# Patient Record
Sex: Male | Born: 1950 | Race: White | Hispanic: No
Health system: Midwestern US, Community
[De-identification: ages and names within clinical notes are randomized; demographics above are authoritative.]

---

## 2016-12-07 NOTE — Discharge Summary (Signed)
Inpatient Patient Summary               [facility]  [address]  [place], [place] [address]  [phone]  Patient Discharge Instructions    Name: Eric Whitney, Eric Whitney  Current Date: 12/07/2016 09:49:21  DOB: 1951-05-02 MRN: [id number] FIN: NBR%[phone]  Patient Address: [address] [place] [facility] [address]  Patient Phone: [phone]  Primary Care Provider:  Name: Eric Whitney  Phone: [phone]   Immunizations Provided:      Discharge Diagnosis: 1:Left inguinal hernia  Discharged To: TO, ANTICIPATED%>  Home Treatments: TREATMENTS, ANTICIPATED%>  Devices/Equipment: EQUIPMENT REHAB%>  Post Hospital Services: HOSPITAL SERVICES%>  Professional Skilled Services: SKILLED SERVICES%>  Therapist, sports and Community Resources: SERV AND COMM RES, ANTICIPATED%>  Mode of Discharge Transportation: TRANSPORTATION%>  Discharge Orders         Discharge Patient 12/07/16 9:22:00 EDT, Discharge Home/Self Care        Comment:     Medications   During the course of your visit, your medication list was updated with the most current information. The details of those changes are reflected below:         New Medications  Printed Prescriptions  HYDROcodone-acetaminophen (Norco 325 mg-5 mg oral tablet) 1-2 tabs Oral (given by mouth) every 6 hours as needed moderate pain (4-7). Refills: 0.  Last Dose:____________________  Medications That Have Not Changed  Other Medications  fenofibrate (fenofibrate 145 mg oral tablet) 1 Tabs Oral (given by mouth) every day.  Last Dose:____________________  losartan-hydrochlorothiazide (losartan-hydrochlorothiazide 100 mg-25 mg oral tablet) 1 Tabs Oral (given by mouth) every day.  Last Dose:____________________  naproxen (naproxen 250 mg oral tablet) 1 Tabs Oral (given by mouth) 2 times a day.  Last Dose:____________________  rosuvastatin (rosuvastatin 20 mg oral tablet) 1 Tabs Oral (given by mouth) every day.  Last Dose:____________________  zolpidem (zolpidem 10 mg oral tablet) 1  Tabs Oral (given by mouth) Once a Day (at bedtime) as needed., " THIS MEDICATION IS ASSOCIATED WITH AN INCREASED RISK OF FALLS."  Last Dose:____________________        [facility] would like to thank you for allowing [place] to assist you with your healthcare needs. The following includes patient education materials and information regarding your injury/illness.    Eric Whitney has been given the following list of follow-up instructions, prescriptions, and patient education materials:  Follow-up Instructions:             With: Address: When:   [facility] [address]  [place], [place]  [address]  [phone] Business (1) Within 1 to 2 weeks                 It is important to always keep an active list of medications available so that you can share with other providers and manage your medications appropriately. As an additional courtesy, we are also providing you with your final active medications list that you can keep with you.          fenofibrate (fenofibrate 145 mg oral tablet) 1 Tabs Oral (given by mouth) every day.  HYDROcodone-acetaminophen (Norco 325 mg-5 mg oral tablet) 1-2 tabs Oral (given by mouth) every 6 hours as needed moderate pain (4-7). Refills: 0.  losartan-hydrochlorothiazide (losartan-hydrochlorothiazide 100 mg-25 mg oral tablet) 1 Tabs Oral (given by mouth) every day.  naproxen (naproxen 250 mg oral tablet) 1 Tabs Oral (given by mouth) 2 times a day.  rosuvastatin (rosuvastatin 20 mg oral  tablet) 1 Tabs Oral (given by mouth) every day.  zolpidem (zolpidem 10 mg oral tablet) 1 Tabs Oral (given by mouth) Once a Day (at bedtime) as needed., " THIS MEDICATION IS ASSOCIATED WITH AN INCREASED RISK OF FALLS."      Take only the medications listed above. Contact your doctor prior to taking any medications not on this list.      Discharge instructions, if any, will display below    Instructions for Diet: INSTRUCTIONS FOR DIET%>Regular diet   Instructions for Supplements: SUPPLEMENT  INSTRUCTIONS%>   Instructions for Activity: INSTRUCTIONS FOR ACTIVITY%>May shower   Instructions for Wound Care: INSTRUCTIONS FOR WOUND CARE%>    Medication leaflets, if any, will display below     Patient education materials, if any, will display below               Do you know the facts about opioid pain medications?   What is an Opioid?  An opioid is a strong prescription pain medication. Some possible side effects include nausea/vomiting, sleepiness/dizziness &/or constipation  Common names of opioids:    Hydrocodone (Vicodin, Norco)    Oxycodone (Percocet, OxyContin)    Morphine    Codeine (Tylenol #3, Tylenol #4)    Fentanyl    Tramadol (Ultram)    Methadone    Hydromorphone (Dilaudid)    Oxymorphone (Opana)   Only use your opioids for the reason they were prescribed.   Using opioids safely    Ask your surgeon if it is okay to use over-the-counter acetaminophen (Tylenol) or ibuprofen (Motrin, Advil).    Use your opioids if you still have severe pain, that is not controlled with the over-the-counter medications, or other non-opioid   prescriptions.    Let your doctor know if you are currently taking any benzodiazepines (i.e. Valium, Xanax).    Do not mix opioids with alcohol or other medications that can cause drowsiness.    As your pain gets better, wait longer between taking opioids.    Only use your opioids for your surgical pain. Do not use your pills for other reasons.    Your opioids are only for you. Do not share your pills with others.   Know the facts about opioid addiction   You are at higher risk of developing a dependence or an addiction to opioids if you:    Have a history of depression or anxiety.    Have a history of using or abusing alcohol, tobacco or drugs (including prescription or street drugs).    Have a history of long term (chronic) pain.    Take opioids for longer than a week.    Take more pills, more often, than your doctor prescribed.   Opioid use puts you at  risk of dependence, addiction or overdose!   Understanding after surgery   Our goal is to control your pain enough to do the things you need to do to heal: walk, sleep, eat & breath deeply.   Things to know:    Pain after surgery is normal.    Everyone feels pain differently.    Pain is usually worse for the first 2-3 days after surgery.    Most patients report using less than half of their opioid pills; many patients do not use any of their pills!   Other things to try for pain relief:    Relaxation, meditation, and music can help control your pain.    Talk to your doctor if your pain is not controlled.  Contact Your Surgeon if you are having difficulty managing your pain.   Safely store opioids out of reach of infants, children, teens & pets.    Lock your pills if possible.    Try to keep a count of how many pills you have left.    Do not store your opioids in places that allow easy access to your pills. (Example: bathrooms, kitchens)   SAFELY dispose of unused opioids:    Medication Take-Back Drives    Pharmacy & police station drop boxes    Mix drugs (do not crush) with used coffee grounds or kitty litter in a plastic bag, then throw away.   To find a list of local places that will take back your unused opioids, visit: http://[email or web address]/take-action/  Reference:   The information in this brochure was taken and adapted from patient materials provided by [employer]. For more information, visit [place].          Discharge Instructions: After Your Surgery   Youve just had surgery. During surgery, you were given medicine called anesthesia to keep you relaxed and free of pain. After surgery, you may have some pain or nausea. This is common. Here are some tips for feeling better and getting well after surgery.       Stay on schedule with your medicine.    Going home   Your healthcare provider will show you how to take care of yourself when you go home. He or she will also answer your  questions. Have an adult family member or friend drive you home. For the first 24 hours after your surgery:    Do not drive or use heavy equipment.    Do not make important decisions or sign legal papers.    Do not drink alcohol.    Have someone stay with you, if needed. He or she can watch for problems and help keep you safe.   Be sure to go to all follow-up visits with your healthcare provider. And rest after your surgery for as long as your healthcare provider tells you to.   Coping with pain   If you have pain after surgery, pain medicine will help you feel better. Take it as told, before pain becomes severe. Also, ask your healthcare provider or pharmacist about other ways to control pain. This might be with heat, ice, or relaxation. And follow any other instructions your surgeon or nurse gives you.   Tips for taking pain medicine   To get the best relief possible, remember these points:    Pain medicines can upset your stomach. Taking them with a little food may help.    Most pain relievers taken by mouth need at least 20 to 30 minutes to start to work.    Taking medicine on a schedule can help you remember to take it. Try to time your medicine so that you can take it before starting an activity. This might be before you get dressed, go for a walk, or sit down for dinner.    Constipation is a common side effect of pain medicines. Call your healthcare provider before taking any medicines such as laxatives or stool softeners to help ease constipation. Also ask if you should skip any foods. Drinking lots of fluids and eating foods such as fruits and vegetables that are high in fiber can also help. Remember, do not take laxatives unless your surgeon has prescribed them.    Drinking alcohol and taking pain medicine can cause dizziness and  slow your breathing. It can even be deadly. Do not drink alcohol while taking pain medicine.    Pain medicine can make you react more slowly to things. Do not drive or  run machinery while taking pain medicine.   Your healthcare provider may tell you to take acetaminophen to help ease your pain. Ask him or her how much you are supposed to take each day. Acetaminophen or other pain relievers may interact with your prescription medicines or other over-the-counter (OTC) medicines. Some prescription medicines have acetaminophen and other ingredients. Using both prescription and OTC acetaminophen for pain can cause you to overdose. Read the labels on your OTC medicines with care. This will help you to clearly know the list of ingredients, how much to take, and any warnings. It may also help you not take too much acetaminophen. If you have questions or do not understand the information, ask your pharmacist or healthcare provider to explain it to you before you take the OTC medicine.   Managing nausea   Some people have an upset stomach after surgery. This is often because of anesthesia, pain, or pain medicine, or the stress of surgery. These tips will help you handle nausea and eat healthy foods as you get better. If you were on a special food plan before surgery, ask your healthcare provider if you should follow it while you get better. These tips may help:    Do not push yourself to eat. Your body will tell you when to eat and how much.    Start off with clear liquids and soup. They are easier to digest.    Next try semi-solid foods, such as mashed potatoes, applesauce, and gelatin, as you feel ready.    Slowly move to solid foods. Dont eat fatty, rich, or spicy foods at first.    Do not force yourself to have 3 large meals a day. Instead eat smaller amounts more often.    Take pain medicines with a small amount of solid food, such as crackers or toast, to avoid nausea.       Call your surgeon if.    You still have pain an hour after taking medicine. The medicine may not be strong enough.    You feel too sleepy, dizzy, or groggy. The medicine may be too strong.    You have  side effects like nausea, vomiting, or skin changes, such as rash, itching, or hives.        If you have obstructive sleep apnea   You were given anesthesia medicine during surgery to keep you comfortable and free of pain. After surgery, you may have more apnea spells because of this medicine and other medicines you were given. The spells may last longer than usual.    At home:    Keep using the continuous positive airway pressure (CPAP) device when you sleep. Unless your healthcare provider tells you not to, use it when you sleep, day or night. CPAP is a common device used to treat obstructive sleep apnea.    Talk with your provider before taking any pain medicine, muscle relaxants, or sedatives. Your provider will tell you about the possible dangers of taking these medicines.      2000-2017 [employer] [address], [place], [place] [address]. All rights reserved. This information is not intended as a substitute for professional medical care. Always follow your healthcare professional's instructions.           IS IT A STROKE?  Act FAST and Check for these signs:    FACE                         Does the face look uneven?    ARM                         Does one arm drift down?    SPEECH                    Does their speech sound strange?    TIME                         Call 9-1-1 at any sign of stroke  Heart Attack Signs  Chest discomfort: Most heart attacks involve discomfort in the center of the chest and lasts more than a few minutes, or goes away and comes back. It can feel like uncomfortable pressure, squeezing, fullness or pain.  Discomfort in upper body: Symptoms can include pain or discomfort in one or both arms, back, neck, jaw or stomach.  Shortness of breath: With or without discomfort.  Other signs: Breaking out in a cold sweat, nausea, or lightheaded.  Remember, MINUTES DO MATTER. If you experience any of these heart attack warning signs, call 9-1-1 to get immediate medical attention!      ---------------------------------------------------------------------------------------------------------------------  [facility] allows you to manage your health, view your test results, and retrieve your discharge documents from your hospital stay securely and conveniently from your computer.  To begin the enrollment process, visit [email or web address] Click on "Sign up now" under [facility].

## 2016-12-07 NOTE — Procedures (Signed)
 IntraOp Record - [facility]             IntraOp Record - [facility] Summary                                                                   Primary Physician:        CHERLYNN BARE III    Case Number:              [id number]    Finalized Date/Time:      12/07/16 09:32:25    Pt. Name:                 Eric Whitney, Eric Whitney    D.O.B./Sex:               July 12, 1950    Male    Med Rec #:                [id number]    Physician:                CHERLYNN BARE III    Financial #:              [id number]    Pt. Type:                 S    Room/Bed:                 /    Admit/Disch:              12/07/16 06:06:00 -    Institution:       [facility] - Case Times                                                                                                         Entry 1                                                                                                          Patient      In Room Time             12/07/16 07:57:00               Out Room Time                   12/07/16 09:26:00    Anesthesia     Procedure  Start Time               12/07/16 08:11:00               Stop Time                       12/07/16 09:16:00    Last Modified By:         MALVINA RN, RON D                              12/07/16 09:31:52      [facility] - Case Times Audit                                                                          12/07/16 09:31:52         Owner: [employer]                               Modifier: CLAYBORN                                                        <+> 1         Out Room Time        <+> 1         Stop Time        [facility] - Case Attendance                                                                                                    Entry 1                         Entry 2                         Entry 3                                          Case Attendee             FROHOCK-MD, JR, JEFFREY         GRICE-MD,  ZACHARY DOUGLAS MALVINA, RN, RON D                              JR  Role Performed            Anesthesiologist                 Surgeon Primary                 Circulator    Time In                   12/07/16 07:57:00               12/07/16 07:57:00               12/07/16 07:57:00    Time Out                  12/07/16 09:26:00               12/07/16 09:26:00               12/07/16 09:26:00    Procedure                 Hernia Repair Inguinal          Hernia Repair Inguinal          Hernia Repair Inguinal                              Open(Left)                      Open(Left)                      Open(Left)    Last Modified By:         MALVINA RN, RON JONETTA MALVINA, RN, RON JONETTA MALVINA, RN, RON D                              12/07/16 09:32:21               12/07/16 09:32:21               12/07/16 09:32:21                                Entry 4                                                                                                          Case Attendee             LOCKIE KUNG C    Role Performed            Surgical Scrub    Time In                   12/07/16  07:57:00    Time Out                  12/07/16 09:26:00    Procedure                 Hernia Repair Inguinal                              Open(Left)    Last Modified By:         MALVINA, RN, RON D                              12/07/16 09:32:21      [facility] - Case Attendance Audit                                                                     12/07/16 09:32:21         Owner: [employer]                               Modifier: WELLRO                                                            1     <+> Time Out            1     <*> Procedure                              Hernia Repair Inguinal Open(Left)            2     <+> Time Out            2     <*> Procedure                              Hernia Repair Inguinal Open(Left)            3     <+> Time In            3     <+> Time Out            3     <*> Procedure                              Hernia Repair Inguinal Open(Left)            4     <+> Time In            4     <+> Time Out            4     <*>  Procedure  Hernia Repair Inguinal Open(Left)     12/07/16 08:12:56         Owner: [employer]                               Modifier: WELLRO                                                            1     <+> Time In            1     <*> Procedure                              Hernia Repair Inguinal Open(Left)            2     <+> Time In            2     <*> Procedure                              Hernia Repair Inguinal Open(Left)        <+> 3         Case Attendee        <+> 3         Role Performed        <+> 3         Procedure        <+> 4         Case Attendee        <+> 4         Role Performed        <+> 4         Procedure        [facility] - Skin Assessment                                                                          Pre-Care Text:            A.240 Assesses baseline skin condition Im.120 Implements protective measures to prevent skin or tissue injury           due to mechanical sources  Im.280.1 Implements progective measures to prevent skin or tissue injury due to           thermal sources Im.360 Monitors for signs and symptons of infection                              Entry 1  Skin Integrity            Intact    Last Modified ByBETHA DIXONS, RN, RON D                              12/07/16 08:21:08    Post-Care Text:            E.10 Evaluates for signs and symptoms of physical injury to skin and tissue E.270 Evaluate tissue perfusion           O.60 Patient is free from signs and symptoms of injury caused by extraneous objects   O.210 Patinet's tissue           perfusion is consistent with or improved from baseline levels      [facility] - Patient Positioning                                                                      Pre-Care Text:            A.240 Assesses baseline skin condition A.280 Identifies baseline musculoskeletal status A.280.1 Identifies           physical  alterations that require additional precautions for procedure-specific positioning A.510.8 Maintains           patient's dignity and privacy Im.120 Implements protective measures to prevent skin/tissue injury due to           mechanical sources Im.40 Positions the patient Im.80 Applies safety devices                              Entry 1                                                                                                          Procedure                 Hernia Repair Inguinal          Body Position                   Supine                              Open(Left)    Left Arm Position         Extended on Padded Arm          Right Arm Position              Extended on Padded Arm  Board w/Security Strap                                          Board w/Security Strap    Left Leg Position         Extended Security Strap         Right Leg Position              Extended Security Strap    Feet Uncrossed            Yes                             Pressure Points                 Yes                                                              Checked     Positioning Device        Gel Pad Full Body, Head         Positioned By                   CHERLYNN BARE III,                              Rest Foam                                                       WELLS, RN, RON D    Outcome Met (O.80)        Yes    Last Modified ByBETHA DIXONS, RN, RON D                              12/07/16 08:19:49    Post-Care Text:            A.240 Assesses baseline skin condition A.280 Identifies baseline musculoskeletal status A.280.1 Identifies           physical alterations that require additional precautions for procedure-specific positioning A.510.8 Maintains           patient's dignity and privacy Im.120 Implements protective measures to prevent skin/tissue injury due to           mechanical sources Im.40 Positions the patient Im.80 Applies safety devices      [facility] - Skin Prep  Pre-Care Text:            A.30 Verifies allergies A.20 Verifies procedure, surgical site, and laterality A.510.8 Maintains paritnet's           dignity and privacy Im.270 Performs Skin Preparation Im.270.1 Implements protective measures to prevent skin           and tissue injury due to chemical sources  A.300.1 Protects from cross-contamination                              Entry 1                                                                                                          Hair Removal     Skin Prep      Prep Agents (Im.270)     Chlorhexidine Gluconate         Prep Area (Im.270)              Groin                              2% w/Alcohol     Prep Area Details        Left                            Prep By                         CHERLYNN BARE III    Outcome Met (O.100)       Yes    Last Modified ByBETHA DIXONS, RN, RON D                              12/07/16 08:21:45    Post-Care Text:            E.10 Evaluates for signs and symptoms of physical injury to skin and tissue O.100 Patient is free from signs           and symptoms of chemical injury  O.740 The patient's right to privacy is maintained      [facility] - Counts Initial and Final                                                                 Pre-Care Text:            A.20.2 - Assesses the risk for unintended retained surgical items Im.20 - Performs required counts  Entry 1                                                                                                          Initial Counts      Initial Counts           WELLS, RN, RON D,               Items included in               Instruments, Sponges,     Performed By             LOCKIE,  ANNA C                the Initial Count               Sharps    Final Counts      Final Counts             WELLS, RN, RON D,               Final Count Status              Correct     Performed By             LOCKIE KUNG C     Items Included in        Sponges, Sharps     Final Count     Outcome Met (O.20)        Yes    Last Modified ByBETHA DIXONS, RN, RON D                              12/07/16 09:14:22    Post-Care Text:            E.50 - Evaluates results of the surgical count O.20 - Patient is free from unintended retained surgical items      [facility] - Counts Initial and Final Audit                                                            12/07/16 09:14:22         Owner: [employer]                               Modifier: CLAYBORN                                                        <+> 1  Final Count Status        <+> 1         Items Included in Final Count        <+> 1         Outcome Met (O.20)        [facility] - Counts Additional                                                                        Pre-Care Text:            A.20 Verifies operative procedure, sugical site, and laterality A.20.2 Assesses the risk for unintended           retained foreign body Im.20 Performs required counts                              Entry 1                                                                                                          Additional Count          Closing Count                   Additional Count                WELLS, RN, RON D,    Type                                                      Participants                    LILLARD,  ANNA C    Count Status              Correct                         Items Counted                   Instruments    Outcome Met (O.20)        Yes    Last Modified ByBETHA DIXONS, RN, RON D                              12/07/16 09:14:45    Post-Care Text:            E.50 Evaluates results of the surgical count O.20 Patient is free from unintended retained foreign objects  [facility] - General Case Data                                                                        Pre-Care Text:            A.350.1 Classifies surgical wound                              Entry 1                                                                                                           Case Information      ASA Class                1                               Case Level                      Level 2     OR                       RH7 07                          Specialty                       General (SN)     Wound Class              1-Clean    Preop Diagnosis           LEFT INGUINAL HERNIA            Postop Diagnosis                SAME    Last Modified ByBETHA DIXONS, RN, RON D                              12/07/16 08:14:03    Post-Care Text:            O.760 Patient receives consistent and comparable care regardless of the setting      [facility] - Fire Risk Assessment  Entry 1                                                                                                          Fire Risk                 Alcohol Based Prep              Fire Risk Score                 2    Assessment: If            Solution, Ignition    checked, checkmark        Source In Use    = 1 point     Last Modified By:         MALVINA, RN, RON D                              12/07/16 08:17:14      [facility] - Time Out                                                                                 Pre-Care Text:            A.10 Confirms patient identity A.20 Verifies operative procedure, surgical site, and laterality A.20.1 Verifies           consent for planned procedure A.30 Verifies allergies                              Entry 1                                                                                                          Procedure                 Hernia Repair Inguinal          Is everyone ready               Yes                              Open(Left)  to perform time out     Have all members of       Yes                             Patient name and                Yes    the surgical team                                          DOB confirmed     been introduced     Allergies discussed       Yes                             Surgical procedure              Yes                                                              to be performed                                                               confirmed and                                                               verified by                                                               completed surgical                                                               consent     Correct surgical          Yes                             Correct laterality              Yes    site marked and  confirmed     initials are     visible through     prepped and draped     field (or     alternative ID band     used)     Correct patient           Yes                             Surgeon shares                  Yes    position confirmed                                        operative plan,                                                               possible                                                               difficulties,                                                               expected duration,                                                               anticipated blood                                                               loss and reviews                                                               all                                                               critical/specific  concerns     Required blood            Yes                             Essential imaging               Yes    products, implants,                                       available and fetal     devices and/or                                            heartones confirmed     special equipment                                         (if applicable)     available and     sterility confirmed      VTE prophylaxis           Yes                             Antibiotics ordered             Yes    addressed                                                 and administered     Anesthesia shares         Yes                             Fire risk                       Yes    anesthetic plan and                                       assessment scored     reviews patient                                           and plan discussed     specific concerns     Appropriate drying        Yes                             Surgeon states:                 Yes    time for prep  Does anyone have     observed before                                           any concerns? If     draping                                                   you see, suspect,                                                               or feel that                                                               patient care is                                                               being compromised,                                                               speak up for                                                               patient safety     Time Out Complete         12/07/16 08:11:00    Last Modified By:         MALVINA, RN, RON D                              12/07/16 08:21:06    Post-Care Text:            E.30 Evaluates verification process for correct patient, site, side, and level surgery      [facility] - Time Out Audit  12/07/16 08:21:06         Owner: [employer]                               Modifier: WELLRO                                                            1     <*> Procedure                              Hernia Repair Inguinal Open(Left)            1     <+> Time Out Complete            1     <+> Surgeon states: Does anyone have                      any concerns? If you see, suspect,                      or feel that  patient care is being                      compromised, speak up for patient                      safety            1     <+> Appropriate drying time for prep                      observed before draping            1     <+> Fire risk assessment scored and                      plan discussed        [facility] - Debrief                                                                                  Pre-Care Text:            Im.330 Manages specimen handling and disposition                              Entry 1  Procedure                 Hernia Repair Inguinal          Final counts                    Yes                              Open(Left)                      correct and                                                               verbally verified                                                               with                                                               surgeon/licensed                                                               independent                                                               practitioner (if                                                               applicable)     Actual procedure          Yes                             Postop diagnosis                Yes    performed confirmed                                       confirmed     Wound  Yes                             Confirm specimens               Yes    classification                                            and specimens     confirmed                                                 labeled                                                               appropriately (if                                                               applicable)     Foley catheter            Yes                             Patient recovery                Yes    removed (if                                                plan confirmed     applicable)     Debrief Complete          12/07/16 09:15:00    Last Modified By:         MALVINA, RN, RON D                              12/07/16 09:15:08    Post-Care Text:            E.800 Ensures continuity of care E.50 Evaluates results of the surgical count O.30 Patient's procedure is           performed on the correct site, side, and level O.50 patient's current status is communicated throughout the           continuum of care O.40 Patient's specimen(s) is managed in the appropriate manner      [facility] - Cautery  Pre-Care Text:            A.240 Assesses baseline skin condition A280.1 Identifies baseline musculoskeletal status Im.50 Implements           protective measures to prevent injury due to electrical sources  Im.60 Uses supplies and equipment within safe           parameters Im.80 Applies safety devices                              Entry 1                                                                                                          ESU Type                  GENERATOR                       Identification                  [id number]                              [employer]              Number     Coag Setting (watts)      35                              Cut Setting (watts)             35    Grounding Pad             Yes                             Grounding Pad Site              Thigh, right    Needed?     Grounding Pad             WELLS, RN, RON D                Outcome Met (O.10)              Yes    Applied By     Last Modified By:         MALVINA, RN, RON D                              12/07/16 08:15:09    Post-Care Text:            E.10 Evaluates for signs and symptoms of physical injury to skin and tissue O.10 Patient is free from signs and           symptoms of injury related to thermal sources  O.70 Patient is  free from signs and symptoms of electrical injury      [facility] - Cautery Audit                                                                              12/07/16 08:15:09         Owner: [employer]                               Modifier: WELLRO                                                            1     <*> Grounding Pad Needed?            1     <*> Grounding Pad Needed?            1     <*> Grounding Pad Site            1     <*> Grounding Pad Site            1     <*> Outcome Met (O.10)            1     <*> Outcome Met (O.10)            1     <*> Grounding Pad Applied By               [employer], RN, RON D            1     <*> Grounding Pad Applied By               [employer], RN, RON D            1     <*> Cut Setting (watts)            1     <*> Cut Setting (watts)        [facility] - Patient Care Devices                                                                     Pre-Care Text:            A.200 Assesses risk for normothermia regulation A.40 Verifies presence of prosthetics or corrective devices           Im.280 Implements thermoregulation measures Im.60 Uses supplies and equipment within safe parameters                              Entry 1  Equipment Type            MACHINE SEQUENTIAL              SCD Sleeve Site                 Legs Bilateral                              COMPRESSION    Initiated Pre             Yes    Induction     Last Modified ByBETHA DIXONS, RN, RON D                              12/07/16 08:18:54    Post-Care Text:            E.10 Evaluates signs and symptoms of physical injury to skin and tissue O.60 Patient is free from signs and           symptoms of injury caused by extraneous objects      [facility] - Medications                                                                              Pre-Care Text:            A.10 Confirms patient identity A.30 Verifies allergies Im.220 Administers prescribed medications Im.220.2           Administers prescribed antibiotic therapy as  ordered                              Entry 1                                                                                                          Time Administered         12/07/16 08:17:00               Medication                      BUPIVACAINE HCL 0.5%  INJECTION 0.5% 30ML    Route of Admin            Local Injection                 Dose/Volume                     20 ML                                                              (include amount and                                                               unit of measure)     Site                      Groin                           Site Detail                     Left    Administered By           CHERLYNN BARE III           Outcome Met (O.130)             Yes    Last Modified ByBETHA DIXONS, RN, RON D                              12/07/16 09:15:21    Post-Care Text:            E.20 Evaluates response to medications O.130 Patient receives appropriately administerd medication(s)      [facility] - Medications Audit                                                                         12/07/16 09:15:21         Owner: [employer]                               Modifier: WELLRO                                                            1     <*> Medication  BUPIVACAINE HCL 0.5% INJECTION 0.5%            1     <*> Dose/Volume (include amount and        10 ML                      unit of measure)        [facility] - Implants/Endoscopy Stents                                                                Pre-Care Text:            A.20 Verifies operative procedure, surgical site, and laterality A.20.1 Verifies consent for planned procedure           Im.350 Records implants inserted during the operative or invasive procedure                              Entry 1                                                                                                           Catalog #                PMII    Implant     Identification      Description              MESH HERNIA PROLENE 3IN         Expiration Date                 09/05/21                              [employer]LE[employer] NI     Lot Number               [id number]                          Manufacturer                    [employer]    Usage Data      Implant Site             L  GROIN                        Quantity                        1    Last Modified By:         MALVINA, RN, RON D  12/07/16 08:40:30    Post-Care Text:            E.30 Evaluates verification process for correct patient, site, side and level surgery O.30 Patient's procedure           is performed on the correct site, side, and level      [facility] - Communication                                                                            Pre-Care Text:            A.520 Identifies barriers to communication (Patient and Family Communications) A.20 Verifies operative           procedure, surgical site, and laterality Sports coach) Im.500 Provides status reports to family           members Im.150 Develops individualized plan of care                              Entry 1                                                                                                          Communication             Phone Call                      Communication By                MALVINA RN, RON D    Date and Time             12/07/16 09:05:00               Communication To                WIFE    Last Modified By:         MALVINA, RN, RON D                              12/07/16 09:32:17    Post-Care Text:            E.520 Evaluates psychosocial response to plan of care O.500 Patient or designated support person demonstrates           knowledge of the expected psychosocial responses to the procedure E.800 Ensures continuity of care O.50           Patient's current status is communicated throughout the continuum of care      [facility] - Dressing/Packing  Pre-Care Text:            A.350 Assesses susceptibility for infection Im.250 Administers care to invasive devices Im.290 Administer care           to wound sites  Im.300 Implements aseptic technique                              Entry 1                                                                                                          Site                      Groin                           Site Details                    Left    Dressing Item     Details      Dressing Item            Liquid Bandage     (Im.290)     Last Modified ByBETHA DIXONS, RN, RON D                              12/07/16 08:17:01    Post-Care Text:            E.320 Evaluate factors associted with increased risk for postoperative infection at the completion of the           procedure O.200 Patient's wound perfusion is consistent with or improved from baseline levels  O.Patient is           free from signs and symptoms of infection      [facility] - Procedures                                                                               Pre-Care Text:            A.20 Verifies operative procedure, surgical site, and laterality Im.150 Develops individualized plan of care                              Entry 1  Procedure     Description      Procedure                Hernia Repair Inguinal          Modifiers                       Left                              Open     Surgical Procedure       LEFT INGUINAL HERNIA     Text                     REPAIR / MESH    Primary Procedure         Yes                             Primary Surgeon                 CHERLYNN BARE III    Start                     12/07/16 08:11:00               Stop                            12/07/16 09:16:00    Anesthesia Type           General                         Surgical Service                General (SN)     Wound Class               1-Clean    Last Modified By:         MALVINA, RN, RON D                              12/07/16 09:32:17    Post-Care Text:            O.730 The patinet's care is consistent with the individualized perioperative plan of care      [facility] - Procedures Audit                                                                          12/07/16 09:32:17         Owner: CLAYBORN                               Modifier: CLAYBORN                                                        <+>  1         Stop        [facility] - Transfer                                                                                                           Entry 1                                                                                                          Transferred By            KANDEE RADDLE, JEFFREY         Via                             Patricio RADDLE DIXONS, RN, RON D    Post-op Destination       PACU    Skin Assessment      Condition                Intact    Last Modified ByBETHA DIXONS, RN, RON D                              12/07/16 08:36:20      Case Comments                                                                                         <None>              Finalized By: DIXONS RN, RON D      Document Signatures  Signed By:           MALVINA, RN, RON D 12/07/16 09:32

## 2016-12-07 NOTE — Discharge Summary (Signed)
 Inpatient Clinical Summary             [facility]  Post-Acute Care Transfer Instructions  PERSON INFORMATION   Name: Eric Whitney, Eric Whitney   MRN: [id number]    FIN#: [id number]   PHYSICIANS  Admitting Physician: CHERLYNN BARE III  Attending Physician: CHERLYNN BARE III   PCP: LYTLE POWELL HERO  Discharge Diagnosis: 1:Left inguinal hernia  Comment:       PATIENT EDUCATION INFORMATION  Instructions:             Opioid Safety (CUSTOM); Anesthesia: After Your Surgery  Medication Leaflets:               Follow-up:                          With: Address: When:   [facility] [address], [address]  [place], [place]  [address]  [phone] Business (1) Within 1 to 2 weeks                           MEDICATION LIST  Medication Reconciliation at Discharge:         New Medications  Printed Prescriptions  HYDROcodone-acetaminophen (Norco 325 mg-5 mg oral tablet) 1-2 tabs Oral (given by mouth) every 6 hours as needed moderate pain (4-7). Refills: 0.  Last Dose:____________________  Medications That Have Not Changed  Other Medications  fenofibrate (fenofibrate 145 mg oral tablet) 1 Tabs Oral (given by mouth) every day.  Last Dose:____________________  losartan-hydrochlorothiazide (losartan-hydrochlorothiazide 100 mg-25 mg oral tablet) 1 Tabs Oral (given by mouth) every day.  Last Dose:____________________  naproxen (naproxen 250 mg oral tablet) 1 Tabs Oral (given by mouth) 2 times a day.  Last Dose:____________________  rosuvastatin (rosuvastatin 20 mg oral tablet) 1 Tabs Oral (given by mouth) every day.  Last Dose:____________________  zolpidem (zolpidem 10 mg oral tablet) 1 Tabs Oral (given by mouth) Once a Day (at bedtime) as needed.,  THIS MEDICATION IS ASSOCIATED WITH AN INCREASED RISK OF FALLS.  Last Dose:____________________         Patient's Final Home Medication List Upon Discharge:          fenofibrate (fenofibrate 145 mg oral tablet) 1 Tabs Oral (given by mouth) every  day.  HYDROcodone-acetaminophen (Norco 325 mg-5 mg oral tablet) 1-2 tabs Oral (given by mouth) every 6 hours as needed moderate pain (4-7). Refills: 0.  losartan-hydrochlorothiazide (losartan-hydrochlorothiazide 100 mg-25 mg oral tablet) 1 Tabs Oral (given by mouth) every day.  naproxen (naproxen 250 mg oral tablet) 1 Tabs Oral (given by mouth) 2 times a day.  rosuvastatin (rosuvastatin 20 mg oral tablet) 1 Tabs Oral (given by mouth) every day.  zolpidem (zolpidem 10 mg oral tablet) 1 Tabs Oral (given by mouth) Once a Day (at bedtime) as needed.,  THIS MEDICATION IS ASSOCIATED WITH AN INCREASED RISK OF FALLS.         Comment:       ORDERS         Order Name Order Details   Discharge Patient 12/07/16 9:22:00 EDT, Discharge Home/Self Care

## 2016-12-07 NOTE — Nursing Note (Signed)
Nursing Discharge Summary - Text       Physician Discharge Summary Entered On:  12/07/2016 9:24 EDT    Performed On:  12/07/2016 9:24 EDT by Genelle GatherGRICE-MD,  Dakwon Wenberg III               DC Information   Provider Instructions for Diet :   Regular diet   Provider Instructions for Activity :   May shower   Genelle GatherGRICE-MD,  Maye Parkinson III - 12/07/2016 9:24 EDT

## 2018-04-23 LAB — LIPID PANEL
Chol/HDL Ratio: 1.8 (ref 0.0–4.4)
Cholesterol: 135 mg/dL (ref 100–200)
HDL: 73 mg/dL — ABNORMAL HIGH (ref 55–72)
LDL Cholesterol: 41 mg/dL (ref 0.0–100.0)
LDL/HDL Ratio: 0.6
Triglycerides: 105 mg/dL (ref 0–149)
VLDL: 21 mg/dL (ref 5.0–40.0)

## 2018-04-23 LAB — HEMOGLOBIN A1C
Est. Avg. Glucose, WB: 111
Est. Avg. Glucose-calculated: 118
Hemoglobin A1C: 5.5 % (ref 4.0–6.0)

## 2018-04-23 LAB — GLUCOSE, FASTING: Glucose, Fasting: 118 mg/dL — ABNORMAL HIGH (ref 70–99)

## 2019-09-03 LAB — HEMOGLOBIN A1C
Est. Avg. Glucose, WB: 108
Est. Avg. Glucose-calculated: 115
Hemoglobin A1C: 5.4 % (ref 4.0–6.0)

## 2019-09-03 LAB — COMPREHENSIVE METABOLIC PANEL
ALT: 14 U/L (ref 0–41)
AST: 20 U/L (ref 0–40)
Albumin/Globulin Ratio: 2 mmol/L (ref 1.00–2.00)
Albumin: 4.9 g/dL (ref 3.5–5.2)
Alk Phosphatase: 60 U/L (ref 40–130)
Anion Gap: 11 mmol/L (ref 2–17)
BUN: 28 mg/dL — ABNORMAL HIGH (ref 8–23)
CO2: 29 mmol/L (ref 22–29)
Calcium: 11.2 mg/dL — ABNORMAL HIGH (ref 8.8–10.2)
Chloride: 101 mmol/L (ref 98–107)
Creatinine: 1.2 mg/dL (ref 0.7–1.3)
GFR African American: 72 mL/min/{1.73_m2} — ABNORMAL LOW (ref >=90)
GFR Non-African American: 62 mL/min/{1.73_m2} — ABNORMAL LOW (ref >=90)
Globulin: 2 g/dL (ref 1.9–4.4)
Glucose: 122 mg/dL — ABNORMAL HIGH (ref 70–99)
OSMOLALITY CALCULATED: 288 mOsm/kg — ABNORMAL HIGH (ref 270–287)
Potassium: 4.1 mmol/L (ref 3.5–5.3)
Sodium: 141 mmol/L (ref 135–145)
Total Bilirubin: 0.6 mg/dL (ref 0.00–1.20)
Total Protein: 7.3 g/dL (ref 6.4–8.3)

## 2019-09-03 LAB — PSA, FREE, REFLEX
PSA, Free Pct: 32.1 %
PSA, Free: 2.44 ng/mL — ABNORMAL HIGH (ref 0.000–0.500)

## 2019-09-03 LAB — LIPID PANEL
Chol/HDL Ratio: 1.9 (ref 0.0–4.4)
Cholesterol: 133 mg/dL (ref 100–200)
HDL: 69 mg/dL (ref 55–72)
LDL Cholesterol: 48.8 mg/dL (ref 0.0–100.0)
LDL/HDL Ratio: 0.7
Triglycerides: 76 mg/dL (ref 0–149)
VLDL: 15.2 mg/dL (ref 5.0–40.0)

## 2019-09-03 LAB — MICROALBUMIN / CREATININE URINE RATIO
Creatinine, Ur: 161.9 mg/dL (ref 39.0–259.0)
Microalb, Ur: 0.4 mg/dL (ref 0.0–20.0)
Microalbumin Creatinine Ratio: 2 mg/g Cr (ref 0–30)

## 2019-09-03 LAB — TSH WITH REFLEX TO FT4: TSH: 2.78 mcIU/mL (ref 0.358–3.740)

## 2019-09-03 LAB — TOTAL PSA WITH FREE PSA REFLEX: PSA, TOTAL: 7.6 ng/mL — ABNORMAL HIGH (ref 0.000–4.000)

## 2019-09-21 LAB — PHOSPHORUS: Phosphorus: 2.7 mg/dL (ref 2.5–4.5)

## 2019-09-21 LAB — CALCIUM: Calcium: 11.5 mg/dL — ABNORMAL HIGH (ref 8.8–10.2)

## 2019-09-21 LAB — PTH, INTACT: Pth Intact: 45.03 pg/mL (ref 15.00–65.00)

## 2020-04-11 LAB — CALCIUM: Calcium: 11.3 mg/dL — ABNORMAL HIGH (ref 8.8–10.2)

## 2020-12-28 IMAGING — MR MRI BRAIN W/WO CONTRAST
10 of 16 series · 31 of 48 positions shown · IV contrast (20CC PROHANCE)
Comparison: None.

HISTORY: Elevated calcium
TECHNIQUE: Multiplanar, multisequential MRI images of the brain are obtained prior to and following 20 mL ProHance intravenous contrast. Pituitary gland protocol utilized.

[Series 1: bSSFP · axial · 8.0mm · 1.17mm/px · z∈[-43,+129]mm · 5 of 19 slices shown]
[im 1/19]
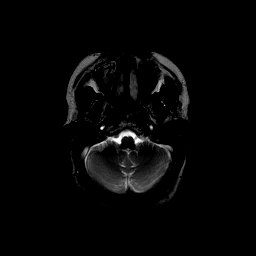
[im 5/19]
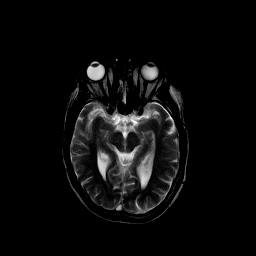
[im 10/19]
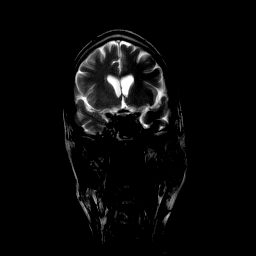
[im 14/19]
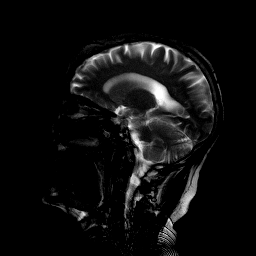
[im 19/19]
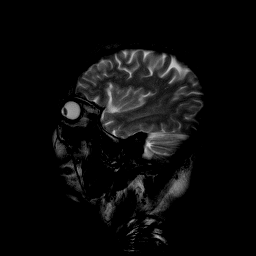

[Series 2: t1_sag · sagittal · 5.0mm · 0.75mm/px · 4 of 20 slices shown (1 of 2)]
[im 1/20]
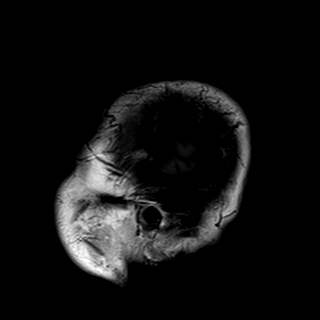
[im 7/20]
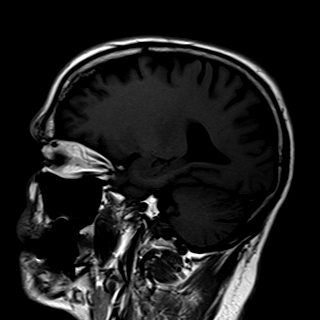
[im 13/20]
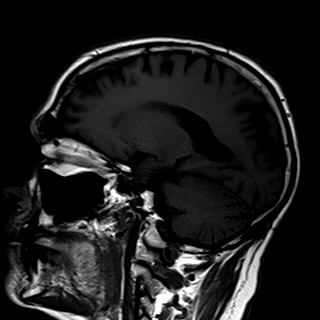
[im 20/20]
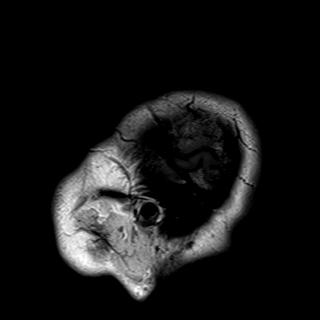

[Series 3: flair_axial_fs · axial · 5.0mm · 0.45mm/px · z∈[-60,+90]mm · 6 of 26 slices shown]
[im 1/26]
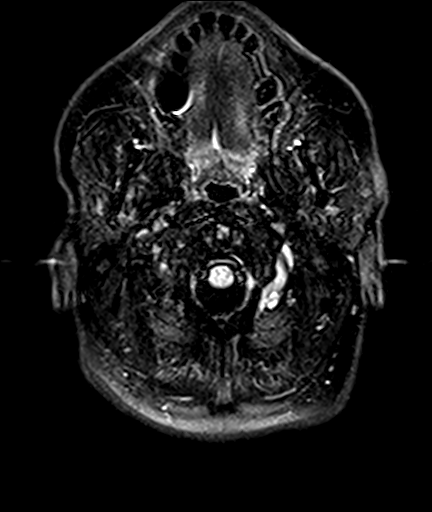
[im 6/26]
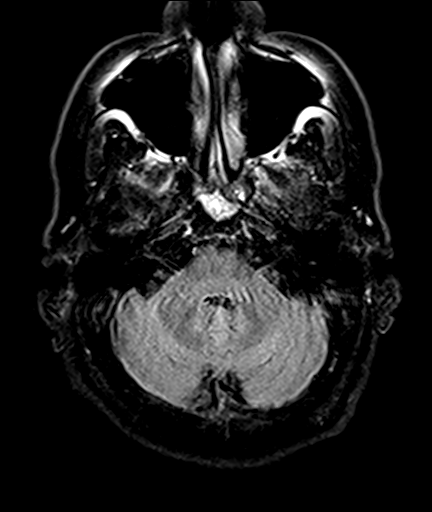
[im 11/26]
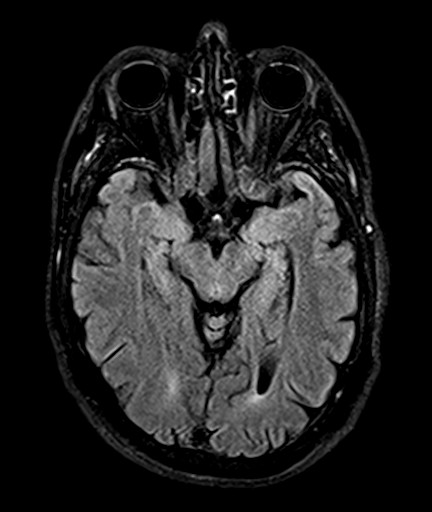
[im 16/26]
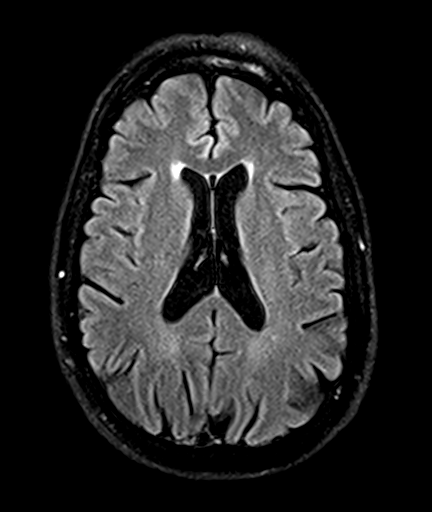
[im 21/26]
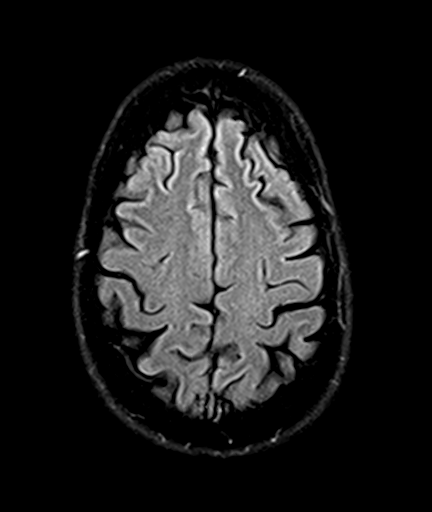
[im 26/26]
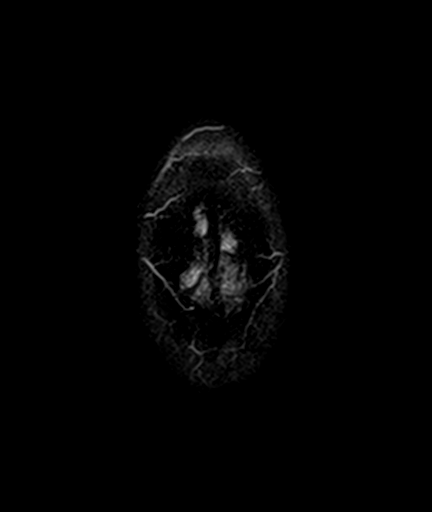

[Series 4: t2_cor_x2.5 · coronal · 3.0mm · 0.72mm/px · 3 of 15 slices shown]
[im 1/15]
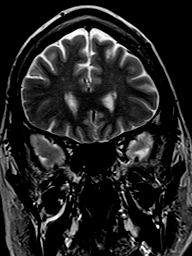
[im 8/15]
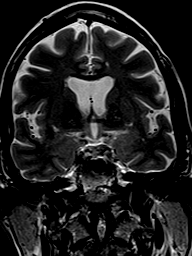
[im 15/15]
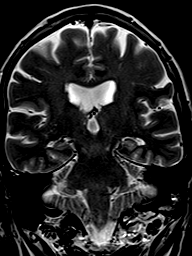

[Series 5: t1_sag · sagittal · 3.0mm · 0.44mm/px · 2 of 11 slices shown (2 of 2)]
[im 1/11]
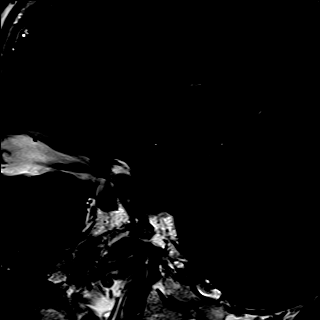
[im 11/11]
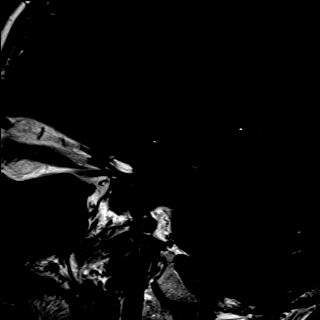

[Series 6: t1_cor_fs · coronal · 3.0mm · 0.78mm/px · 3 of 13 slices shown]
[im 1/13]
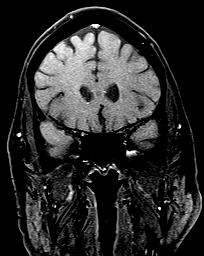
[im 7/13]
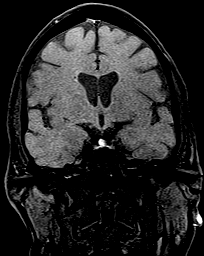
[im 13/13]
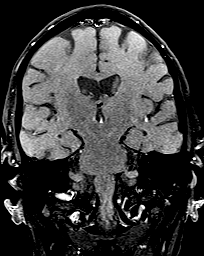

[Series 7: t1_cor · coronal · 3.0mm · 0.31mm/px · 2 of 9 slices shown]
[im 1/9]
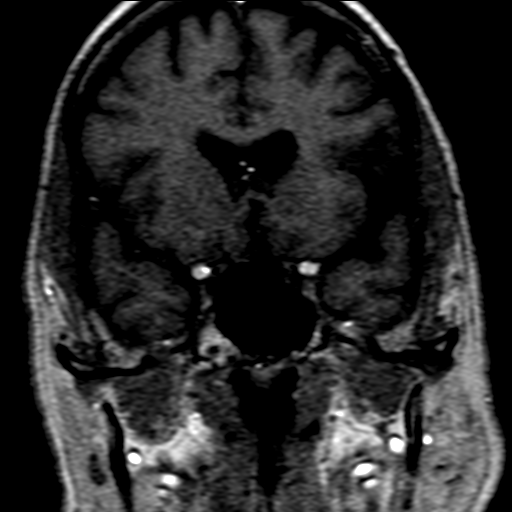
[im 9/9]
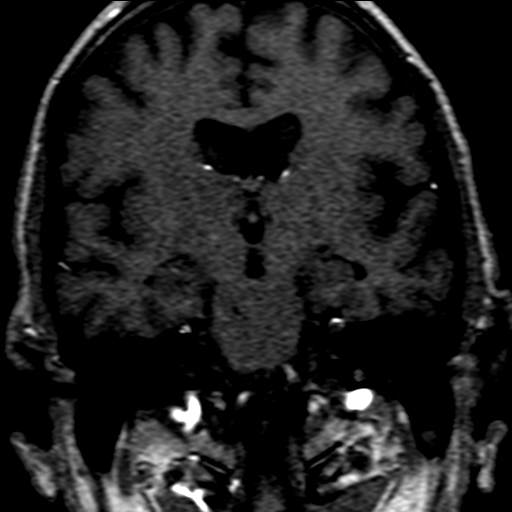

[Series 8: t1_cor_0 · coronal · 3.0mm · 0.31mm/px · 2 of 9 slices shown]
[im 1/9]
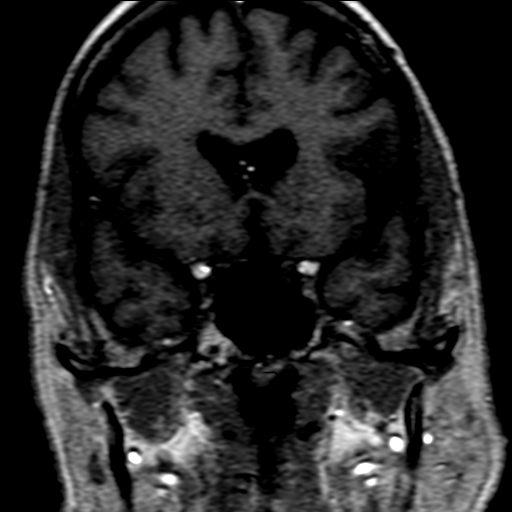
[im 9/9]
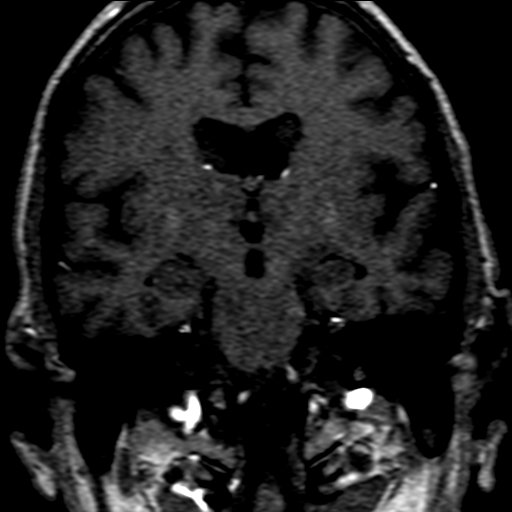

[Series 9: t1_cor_30 · coronal · 3.0mm · 0.31mm/px · 2 of 9 slices shown]
[im 1/9]
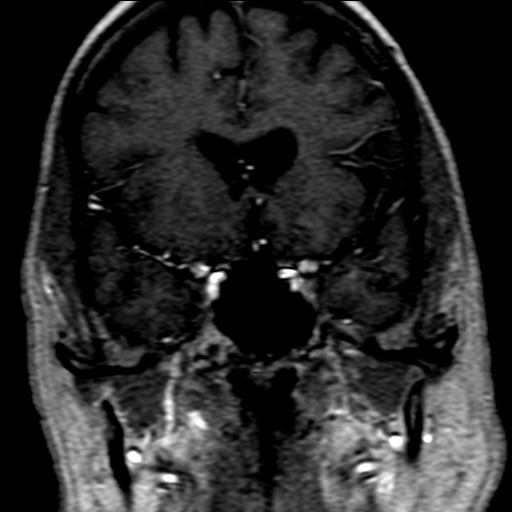
[im 9/9]
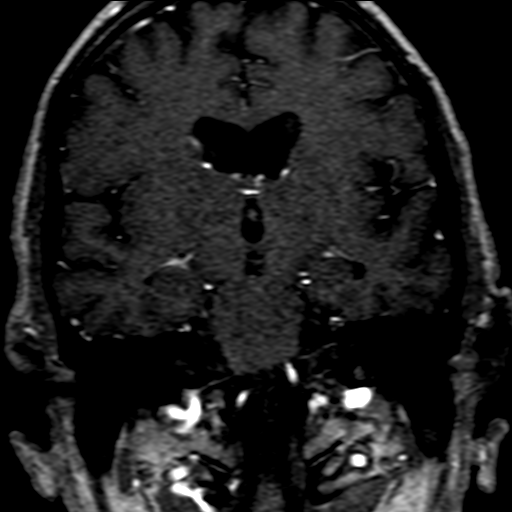

[Series 10: t1_cor_60 · coronal · 3.0mm · 0.31mm/px · 2 of 9 slices shown]
[im 1/9]
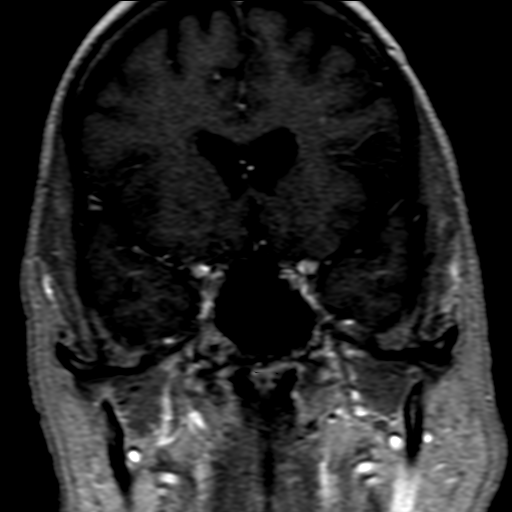
[im 9/9]
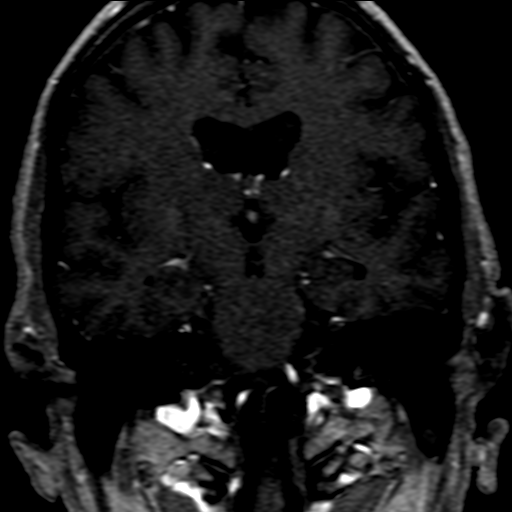

[31 of 48 positions shown; findings below may reference images not displayed]

FINDINGS: Ventricles and cortical sulci are slightly prominent. Scattered periventricular and subcortical FLAIR and T2 hyperintensity foci seen. Midline intracranial structures are normal. Flow voids of main intracranial vasculature are normal. There is no mass lesion or midline shift. No extra-axial fluid collection identified. Mucosal thickening of ethmoid air cells and maxillary sinuses is seen. The rest of the orbits, paranasal sinuses, mastoid air cells and skull marrow signal are normal.

Pituitary gland shows normal signal and morphology. No pituitary adenoma is identified. Pituitary stalk is normal. Cavernous sinuses and Meckel's caves normal. Optic tract, optic chiasma and optic nerves normal.
IMPRESSION: Mild cerebral atrophy or involutional changes with mild chronic small vessel ischemic changes seen.

Chronic paranasal sinusitis seen.

No pituitary gland pathology seen.

CT neck and nuclear medicine parathyroid gland evaluation may evaluate possibility of hyperparathyroidism.

## 2021-03-31 IMAGING — OT DXA BONE DENSITY
2 series · 2 of 2 positions shown · non-contrast
Comparison: none

REASON FOR EXAM: Evaluate bone density.

RISK FACTORS:  None
PRIOR EXAMS:  None
METHOD:  Scans of the spine and hip were performed using dual energy X-ray densitometry (DXA).

[Series 1: — · 1 of 1 slices shown (1 of 2)]
[im 1/1]
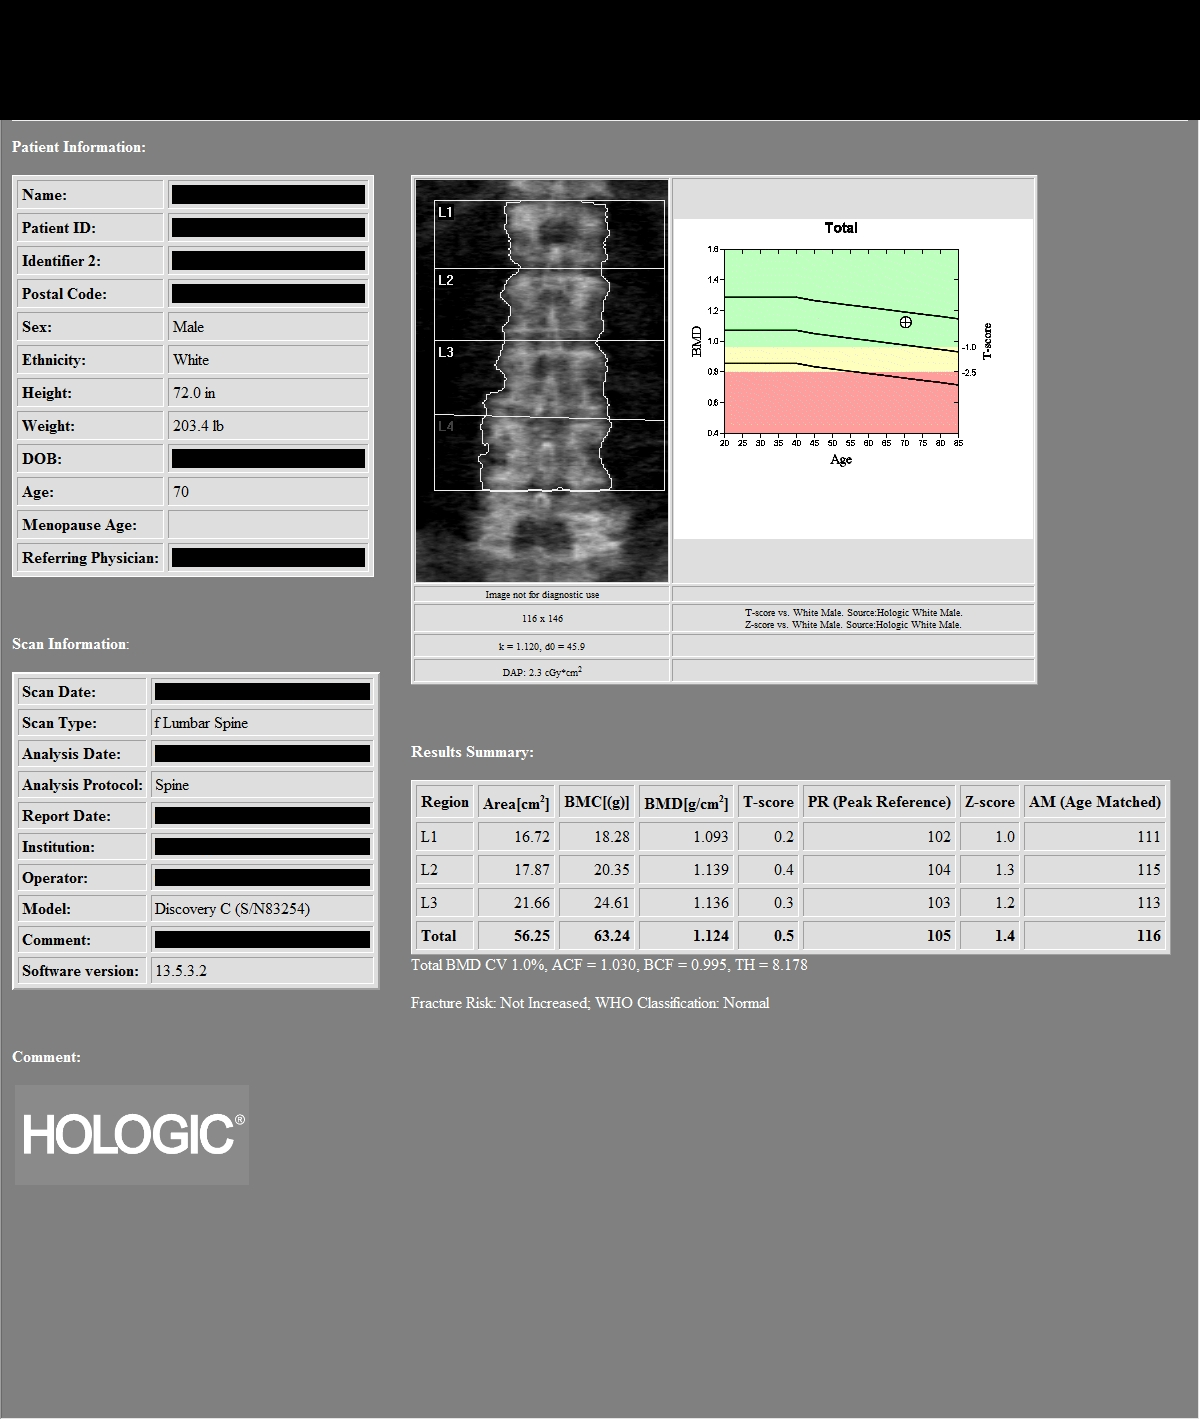

[Series 2: — · left · 1 of 1 slices shown (2 of 2)]
[im 1/1]
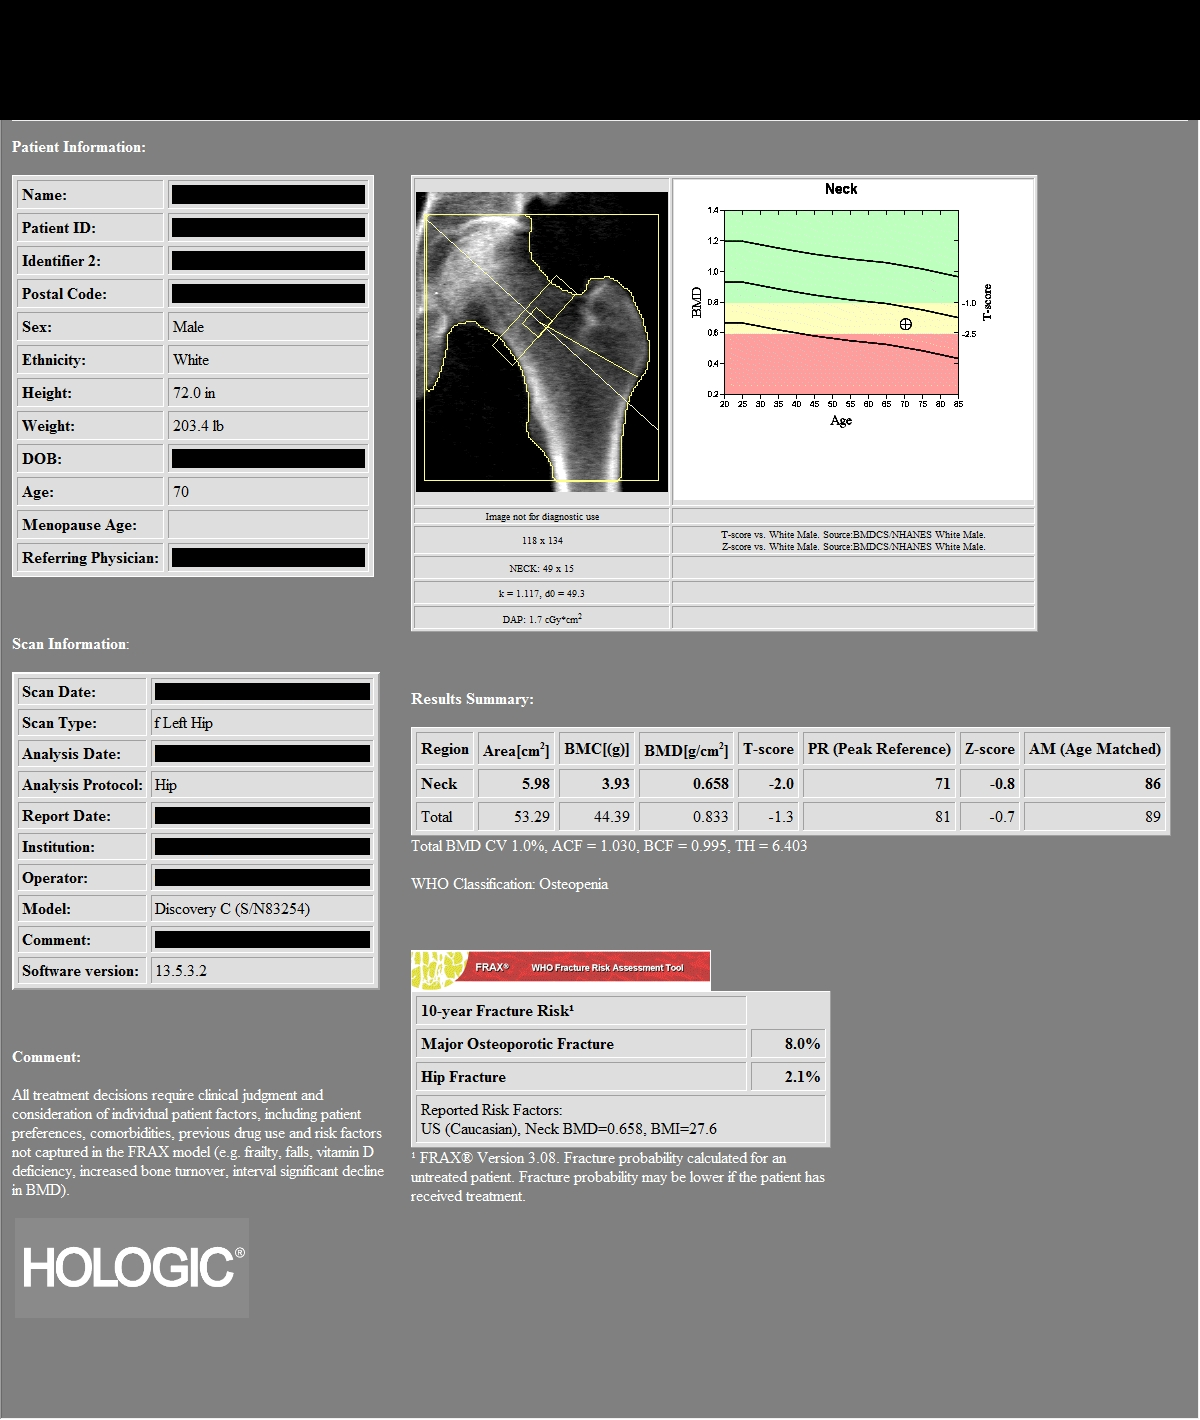

[2 of 2 positions shown; findings below may reference images not displayed]

IMPRESSION: As defined by World Health Organization, the patient meets the criteria for OSTEOPENIA based on left hip T-scores.

PATIENT DEMOGRAPHICS:  70-year-old white  male.
FINDINGS: 1.    Review of scanogram images shows no factor invalidating scan results.  The L4 vertebra was not included in evaluation since it was one standard deviation denser than the rest.

2.    The lumbar spine exam using L1-L3 regions shows average Bone Mineral Density is 1.124 gm/cm2 of Hydroxyapatite.  The T-score (comparing patient with a young adult group) is 0.5 standard deviations ABOVE mean. The Z-score (comparing patient with an age-matched group) is 1.4 standard deviations ABOVE mean.

3.  The left hip exam using femoral neck region of interest shows average Bone Mineral Density is 0.658 gm/cm2 of Hydroxyapatite. The T-score (comparing patient with a young adult group) is 2.0 standard deviations BELOW mean. The Z-score (comparing patient with an age-matched group) is 0.8 standard deviations BELOW mean.

According to the World Health Organization risk assessment tool (FRAX) for osteopenia only, which uses the femoral neck T score and includes other patient risk factors for fracture, the patient has a 10-year absolute risk of hip fracture of 2.1% and 10-year absolute risk fracture for any major fracture of 8.0%.

RECOMMENDATIONS:  The patient states that he is not taking supplements on a regular basis.  The patient should continue being a nonsmoker and regular exercise to patient tolerance would be of benefit.  The patient is currently not taking prescribed medication for prevention of bone loss.  According to criteria established by the National Osteoporosis Foundation, the patient DOES NOT meet the current indications for prescribed medical therapy.  The National Osteoporosis Foundation now recommends followup DXA scanning every two years in patients at risk regardless of whether the patient is undergoing pharmacological treatment.

World Health Organization criteria for diagnosis, please see link below.

[URL]

## 2022-03-29 IMAGING — CR CHEST 2 VWS PA LAT
3 series · 3 of 3 positions shown · non-contrast
Comparison: None

Images Obtained from [facility]
HISTORY: 71-year-old male with preprocedural examination
TECHNIQUE: 2 view radiograph of the chest.

[w chest pa (1 of 2)]
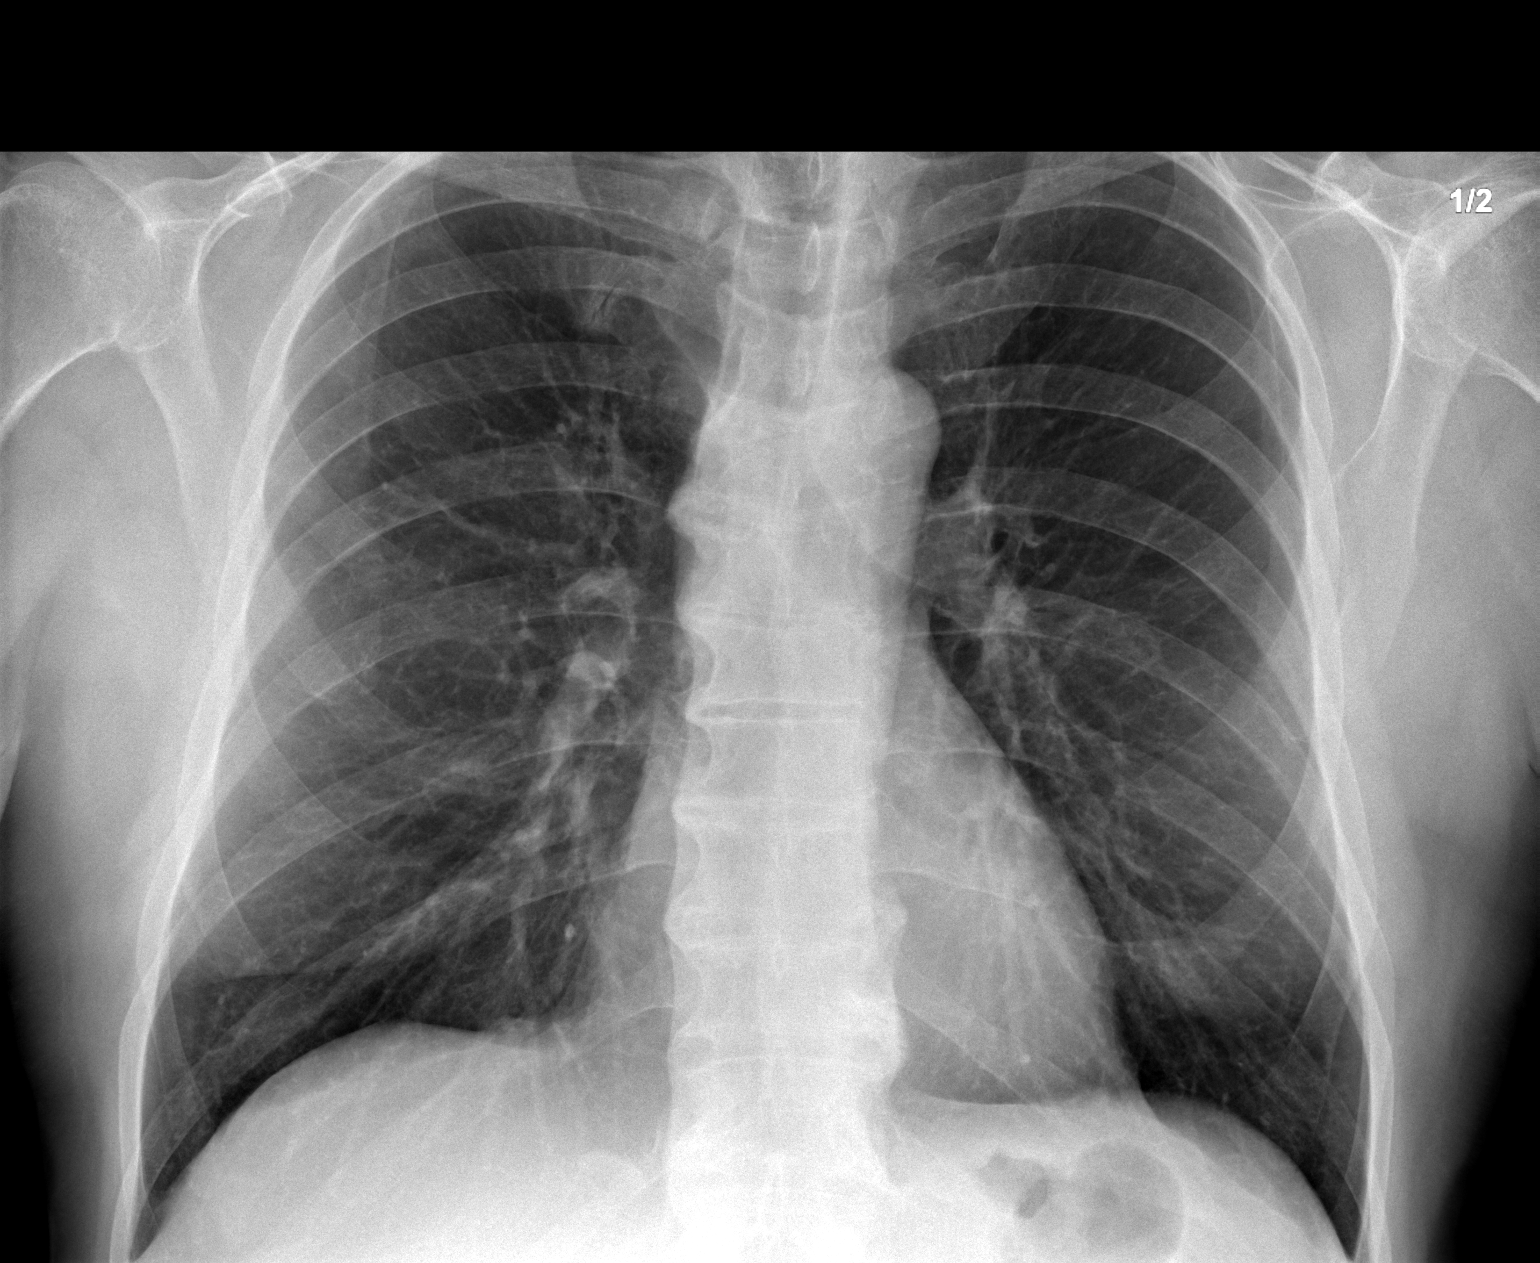

[w chest lat]
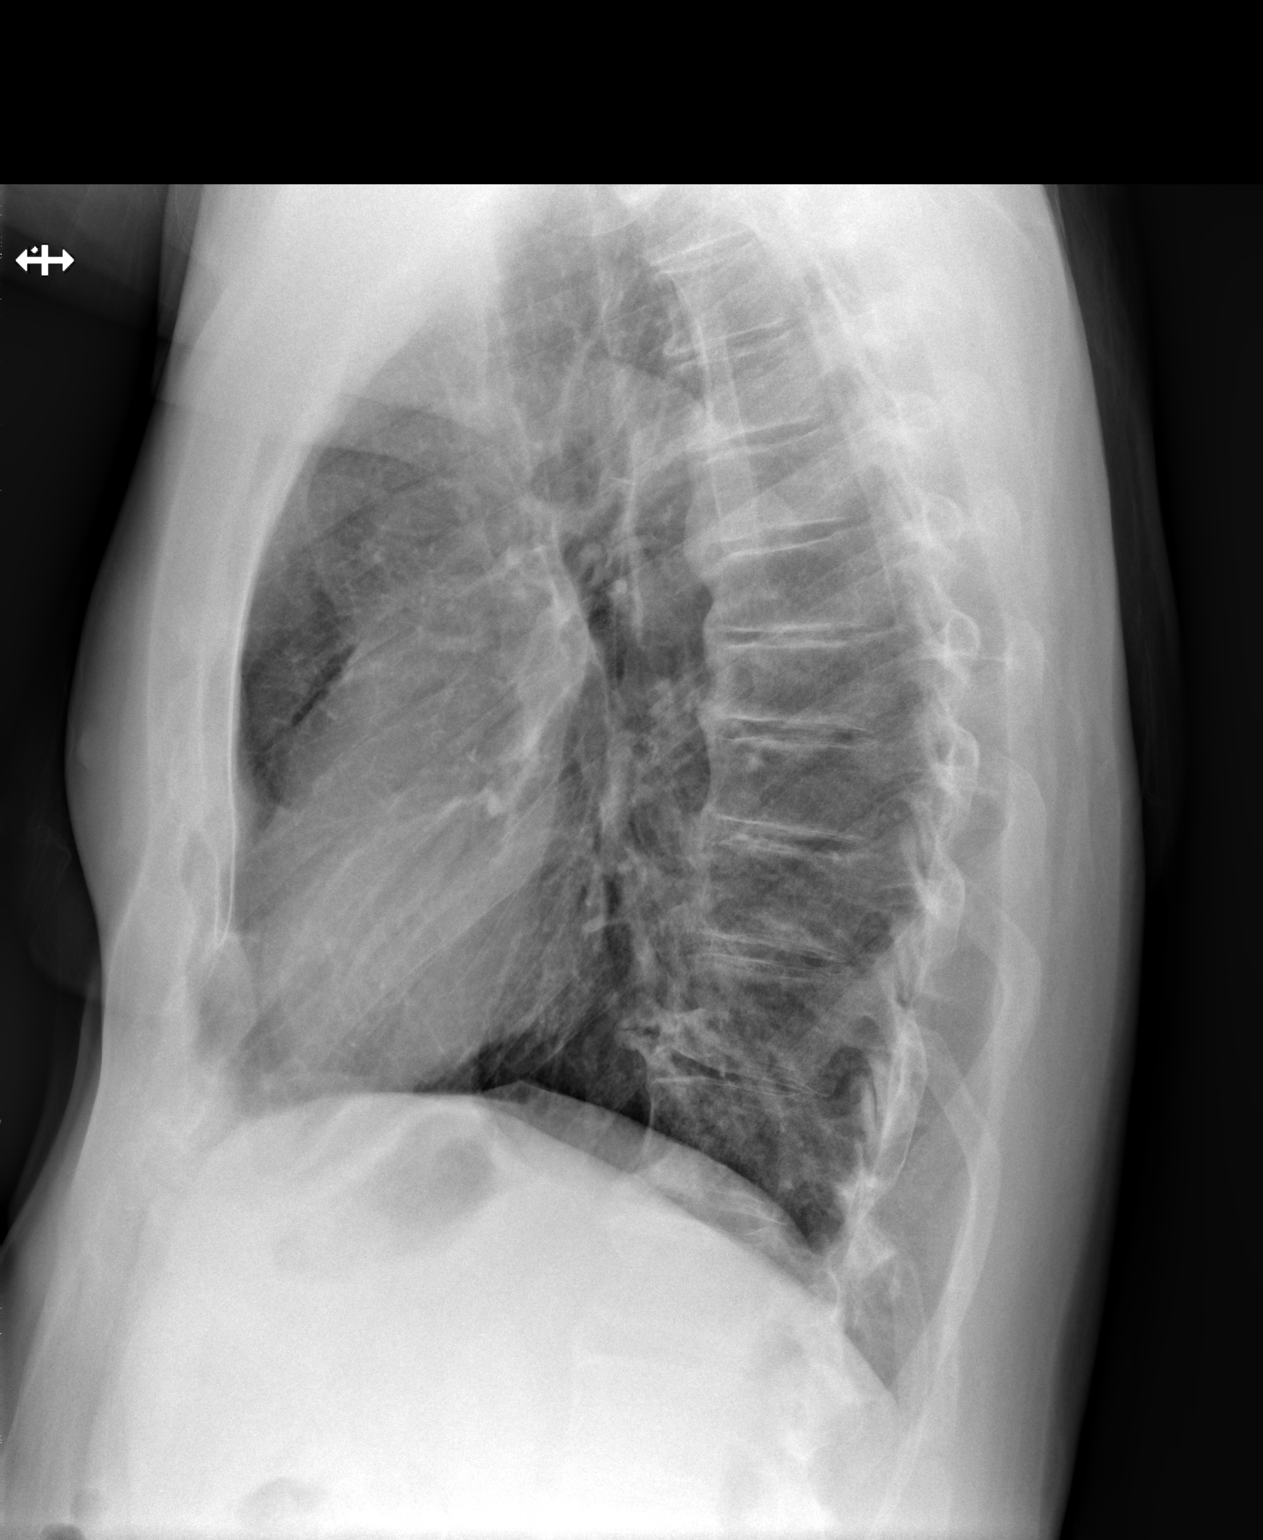

[w chest pa (2 of 2)]
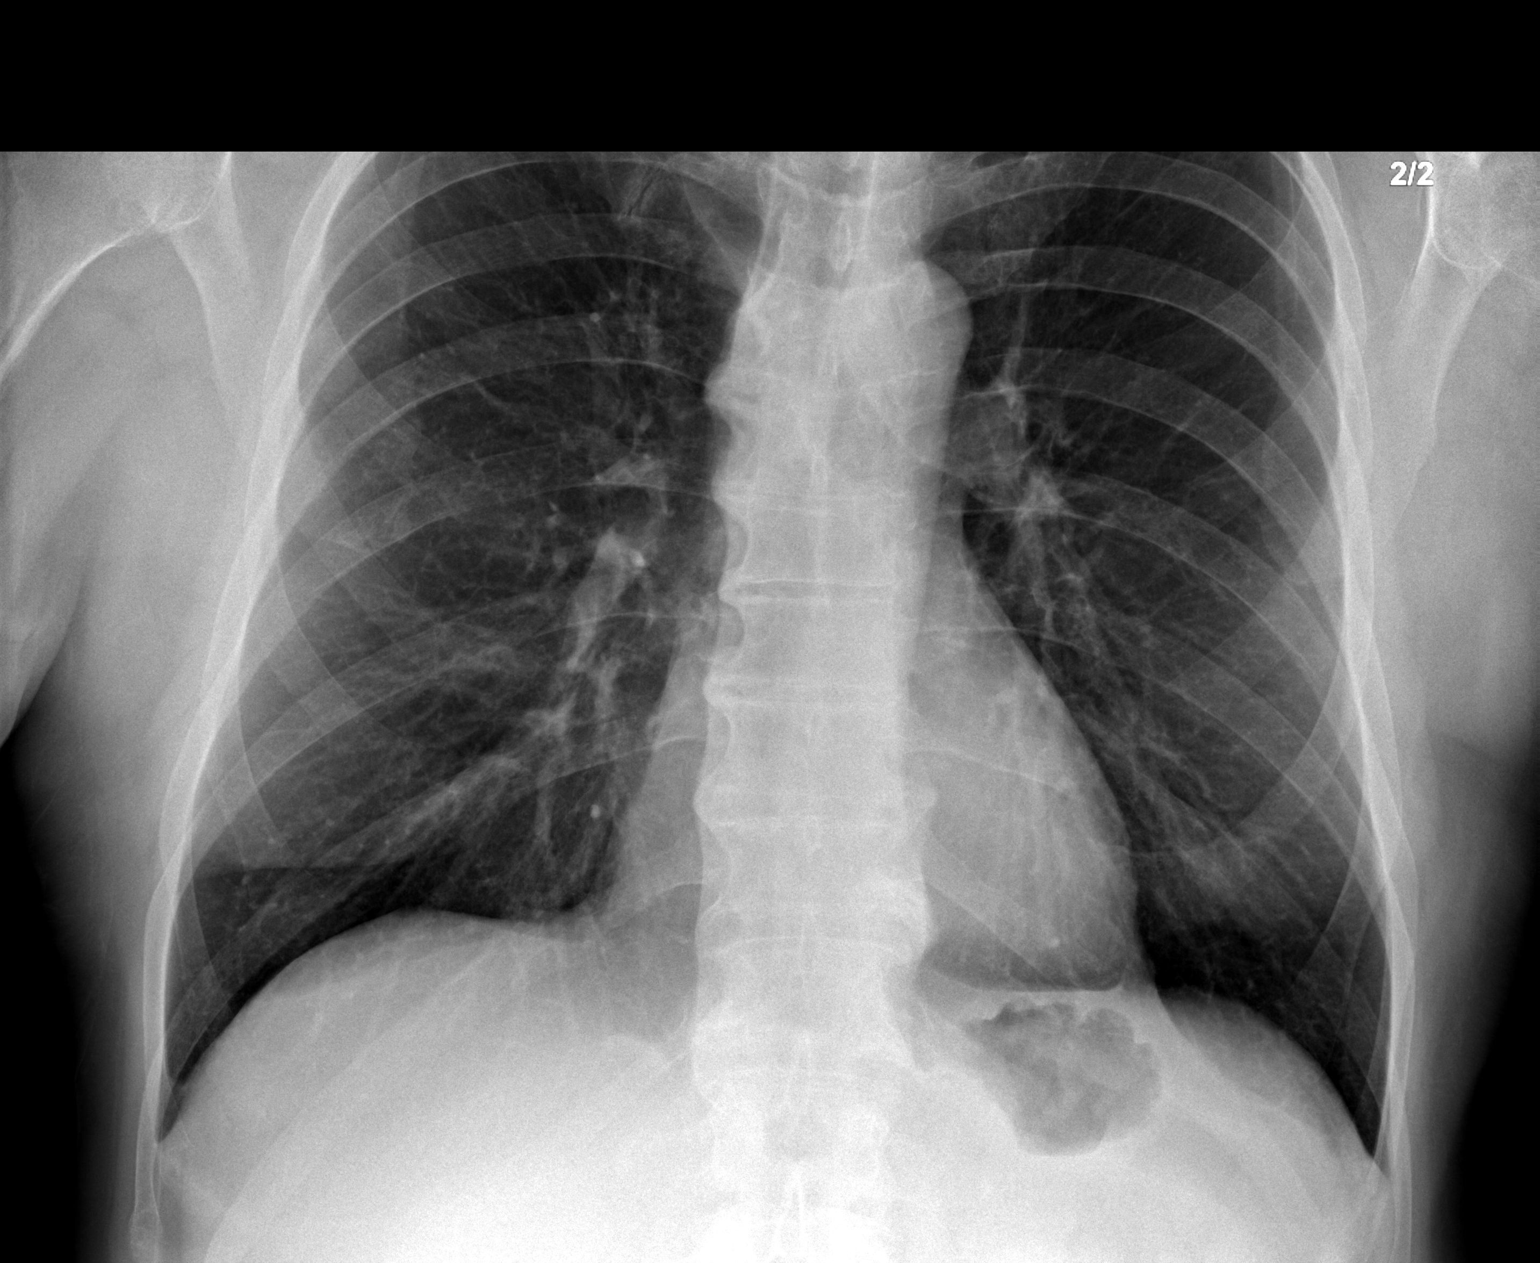

[3 of 3 positions shown; findings below may reference images not displayed]

FINDINGS: Normal heart size. The lungs are clear. No effusion.
IMPRESSION: No acute cardiopulmonary process.

## 2023-01-25 IMAGING — MR MRI PROSTATE WO/W CONTRAST
8 series · 48 of 48 positions shown · non-contrast
Comparison: None.

Images Obtained from [facility]
HISTORY: 72 years-old Male with Elevated prostate specific antigen [PSA].

[Series 3: t1_axial · axial · 6.0mm · 0.87mm/px · 1 of 34 slices shown]
[im 1/34]
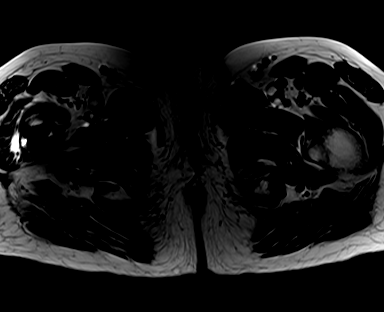

[Series 4: t2_blade_sag · sagittal · 3.0mm · 0.66mm/px · 1 of 30 slices shown]
[im 1/30]
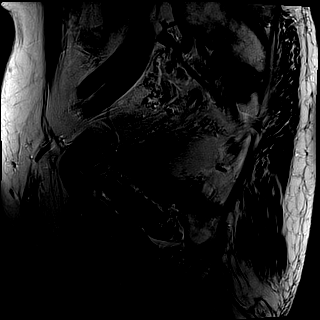

[Series 5: t2_axial · axial · 3.0mm · 0.56mm/px · 1 of 34 slices shown]
[im 1/34]
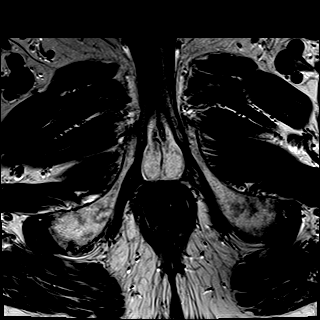

[Series 6: t2_cor · coronal · 3.0mm · 0.62mm/px · 1 of 26 slices shown]
[im 1/26]
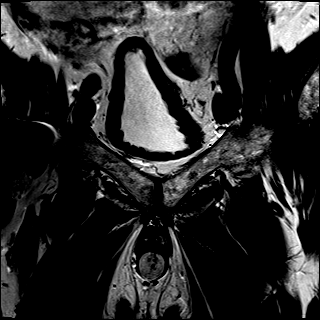

[Series 7: resolve_diff_b50_800_(person_name)_(person_name)2_tracew · axial · 3.5mm · 1.43mm/px · z∈[-67,+7]mm · 2 of 44 slices shown]
[im 1/44]
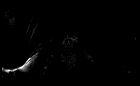
[im 44/44]
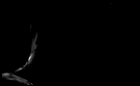

[Series 8: resolve_diff_b50_800_(person_name)_(person_name)2_adc · axial · 3.5mm · 1.43mm/px · 1 of 22 slices shown]
[im 1/22]
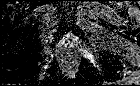

[Series 9: resolve_diff_b50_800_(person_name)_(person_name)2_calc_bval · axial · 3.5mm · 1.43mm/px · 1 of 20 slices shown]
[im 1/20]
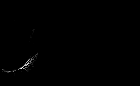

[Series 10: t1_twist_axial_dyn_+c · axial · 3.5mm · 1.04mm/px · z∈[-68,+5]mm · 40 of 1056 slices shown]
[im 1/1056]
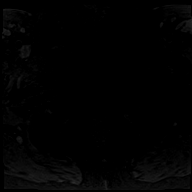
[im 28/1056]
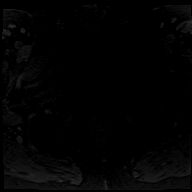
[im 55/1056]
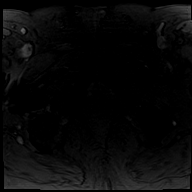
[im 82/1056]
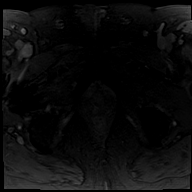
[im 109/1056]
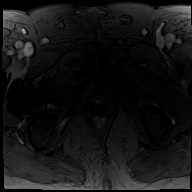
[im 136/1056]
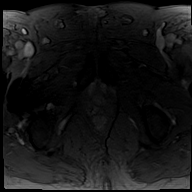
[im 163/1056]
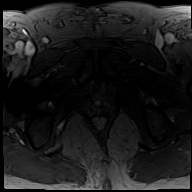
[im 190/1056]
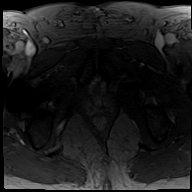
[im 217/1056]
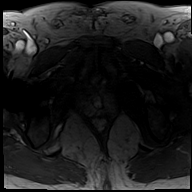
[im 244/1056]
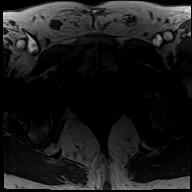
[im 271/1056]
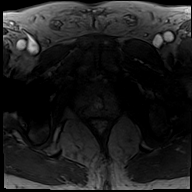
[im 298/1056]
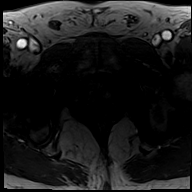
[im 325/1056]
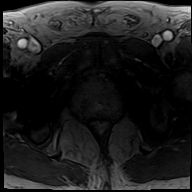
[im 352/1056]
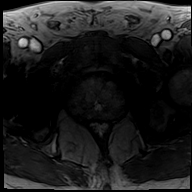
[im 379/1056]
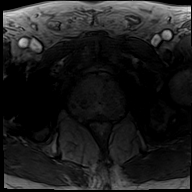
[im 406/1056]
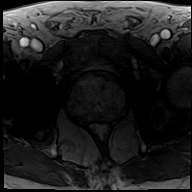
[im 433/1056]
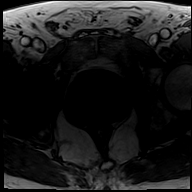
[im 460/1056]
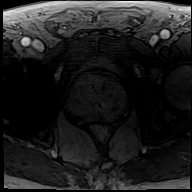
[im 487/1056]
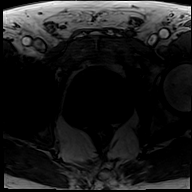
[im 514/1056]
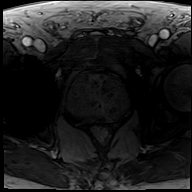
[im 542/1056]
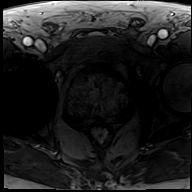
[im 569/1056]
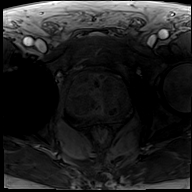
[im 596/1056]
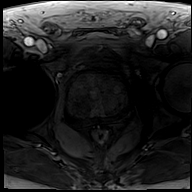
[im 623/1056]
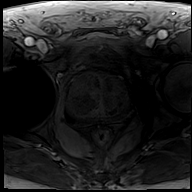
[im 650/1056]
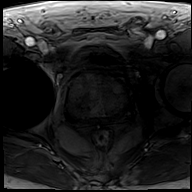
[im 677/1056]
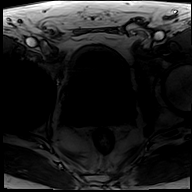
[im 704/1056]
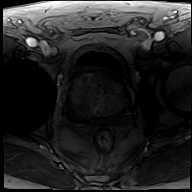
[im 731/1056]
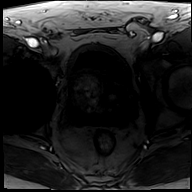
[im 758/1056]
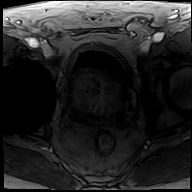
[im 785/1056]
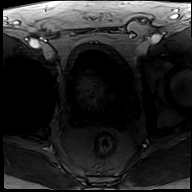
[im 812/1056]
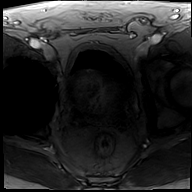
[im 839/1056]
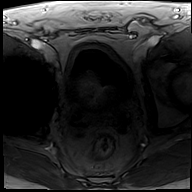
[im 866/1056]
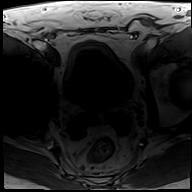
[im 893/1056]
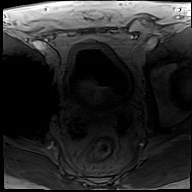
[im 920/1056]
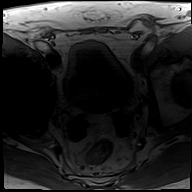
[im 947/1056]
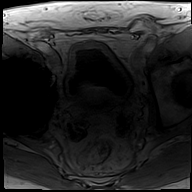
[im 974/1056]
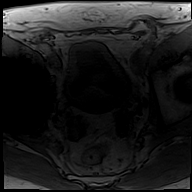
[im 1001/1056]
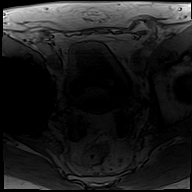
[im 1028/1056]
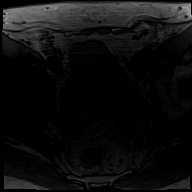
[im 1056/1056]
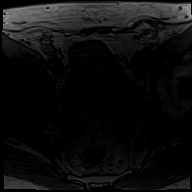

[48 of 48 positions shown; findings below may reference images not displayed]

TECHNIQUE
*  MR images of the pelvis were acquired with a 3T scanner before and after the administration of 20 cc ProHance IV contrast
PI-RADS Assessment Categories
*  PI-RADS 1 - Very low (clinically significant cancer is highly unlikely to be present)
*  PI-RADS 2 - Low (clinically significant cancer is unlikely to be present)
*  PI-RADS 3 - Intermediate (the presence of clinically significant cancer is equivocal)
*  PI-RADS 4 - High (clinically significant cancer is likely to be present)
*  PI-RADS 5 - Very high (clinically significant cancer is highly likely to be present)
FINDINGS
Prostate Dimensions
*  6.3 x 5.7 x 7.7 cm
*  Volume 144.78 mL
PSA Density
*  0.07 ng/mL/mL
*  (Based on PSA 9.5 ng/mL)
Post-Interventional Signal
*  None
Peripheral Zone
*  1.1 cm T2 hypodense lesion corresponding decreased ADC and increased diffusion involving the right posterior medial peripheral zone (series 8, series 9, images 16).
Transition Zone
*  Benign prostatic hypertrophy.
Neurovascular Bundles
*  Negative
Seminal Vesicles
Lymph Nodes
*  No suspicious lymph nodes
Bones
*  Status post total right hip replacement. Metallic hardware obscured detailed evaluation portion of the right right pelvic sidewall.
Pelvic Viscera
*  Unremarkable
1.1 cm T2 hypodense lesion corresponding decreased ADC and increased diffusion involving the right posterior medial peripheral zone (series 8, series 9, images 16). PI-RADS 4 - High  (clinically
significant cancer is likely to be present).
